# Patient Record
Sex: Female | Born: 1990 | Race: White | Hispanic: Yes | Marital: Married | Smoking: Never smoker
Health system: Southern US, Community
[De-identification: ages and names within clinical notes are randomized; demographics above are authoritative.]

## PROBLEM LIST (undated history)

## (undated) DIAGNOSIS — Z789 Other specified health status: Secondary | ICD-10-CM

## (undated) HISTORY — DX: Other specified health status: Z78.9

---

## 2020-08-22 HISTORY — PX: FINGER SURGERY: SHX640

## 2020-10-30 ENCOUNTER — Ambulatory Visit: Payer: Self-pay

## 2020-11-06 ENCOUNTER — Ambulatory Visit (LOCAL_COMMUNITY_HEALTH_CENTER): Payer: Self-pay | Admitting: Physician Assistant

## 2020-11-06 ENCOUNTER — Other Ambulatory Visit: Payer: Self-pay

## 2020-11-06 VITALS — BP 110/67 | Ht 66.0 in | Wt 160.0 lb

## 2020-11-06 DIAGNOSIS — Z3009 Encounter for other general counseling and advice on contraception: Secondary | ICD-10-CM

## 2020-11-06 DIAGNOSIS — Z30011 Encounter for initial prescription of contraceptive pills: Secondary | ICD-10-CM

## 2020-11-06 DIAGNOSIS — Z0000 Encounter for general adult medical examination without abnormal findings: Secondary | ICD-10-CM

## 2020-11-06 MED ORDER — NORGESTIMATE-ETH ESTRADIOL 0.25-35 MG-MCG PO TABS: 1.0000 | ORAL_TABLET | Freq: Every day | ORAL | 0 refills | Status: AC

## 2020-11-06 NOTE — Progress Notes (Signed)
#  1 pack of Sprintec given and Nexplanon insertion appt given.  Consult completed Richmond Campbell, RN

## 2020-11-06 NOTE — Progress Notes (Signed)
[facility] DEPARTMENT [facility] [address] Main Number: [phone]    Family Planning Visit- Initial Visit  Subjective:  Jennifer Rice is a 30 y.o.  G1P1001   being seen today for an initial annual visit and to discuss contraceptive options.  The patient is currently using None for pregnancy prevention. Patient reports she does not want a pregnancy in the next year.  Patient has the following medical conditions does not have a problem list on file.  Chief Complaint  Patient presents with  . Contraception    Initial annual exam and discuss BCM.    Patient reports that she would like to start birth control.  Reports that she has previously used an IUD and OCs and thinks that she would like to try the Nexplanon.  Reports a history of irregular periods and LMP 11/02/2020 and normal.  Last type of OCs was monophasic, with 2 colors of OCs.  Patient reports last pap was 2 years ago in [place]" and was normal.  Patient declines pelvic today for pap and declines all STD screening including blood work.  Reports that she has had headaches off and on always and denies any increase or change to them.  States headaches are usually related to stressors and relieved with OTC analgesics. Patient counseled that pap would be due next year.  Patient denies any other concerns today.    Body mass index is 25.82 kg/m. - Patient is eligible for diabetes screening based on BMI and age >30?  not applicable HA1C ordered? not applicable  Patient reports 1  partner/s in last year. Desires STI screening?  No - patient declines.  Has patient been screened once for HCV in the past?  No  No results found for: HCVAB  Does the patient have current drug use (including MJ), have a partner with drug use, and/or has been incarcerated since last result? No  If yes-- Screen for HCV through [facility]   Does the patient meet criteria for HBV testing? No  Criteria:   -Household, sexual or needle sharing contact with HBV -History of drug use -HIV positive -Those with known Hep C   Health Maintenance Due  Topic Date Due  . Hepatitis C Screening  Never done  . HIV Screening  Never done  . TETANUS/TDAP  Never done  . PAP-Cervical Cytology Screening  Never done  . PAP SMEAR-Modifier  Never done  . INFLUENZA VACCINE  Never done    Review of Systems  All other systems reviewed and are negative.   The following portions of the patient's history were reviewed and updated as appropriate: allergies, current medications, past family history, past medical history, past social history, past surgical history and problem list. Problem list updated.   See flowsheet for other program required questions.  Objective:   Vitals:   11/06/20 0941  BP: 110/67  Weight: 160 lb (72.6 kg)  Height: 5\' 6"  (1.676 m)    Physical Exam Vitals and nursing note reviewed.  Constitutional:      General: She is not in acute distress.    Appearance: Normal appearance.  HENT:     Head: Normocephalic and atraumatic.     Mouth/Throat:     Mouth: Mucous membranes are moist.     Pharynx: Oropharynx is clear. No oropharyngeal exudate or posterior oropharyngeal erythema.  Eyes:     Conjunctiva/sclera: Conjunctivae normal.  Neck:     Thyroid: No thyroid mass, thyromegaly or thyroid  tenderness.  Cardiovascular:     Rate and Rhythm: Normal rate and regular rhythm.  Pulmonary:     Effort: Pulmonary effort is normal.     Breath sounds: Normal breath sounds.  Chest:  Breasts:     Right: Normal. No mass, nipple discharge, skin change, tenderness, axillary adenopathy or supraclavicular adenopathy.     Left: Normal. No mass, nipple discharge, skin change, tenderness, axillary adenopathy or supraclavicular adenopathy.    Abdominal:     Palpations: Abdomen is soft. There is no mass.     Tenderness: There is no abdominal tenderness. There is no guarding or rebound.   Musculoskeletal:     Cervical back: Neck supple. No tenderness.  Lymphadenopathy:     Cervical: No cervical adenopathy.     Upper Body:     Right upper body: No supraclavicular, axillary or pectoral adenopathy.     Left upper body: No supraclavicular, axillary or pectoral adenopathy.  Skin:    General: Skin is warm and dry.     Findings: No bruising, erythema, lesion or rash.  Neurological:     Mental Status: She is alert and oriented to person, place, and time.  Psychiatric:        Mood and Affect: Mood normal.        Behavior: Behavior normal.        Thought Content: Thought content normal.        Judgment: Judgment normal.       Assessment and Plan:  Jennifer Rice is a 30 y.o. female presenting to the [facility] Department for an initial annual wellness/contraceptive visit  Contraception counseling: Reviewed all forms of birth control options in the tiered based approach. available including abstinence; over the counter/barrier methods; hormonal contraceptive medication including pill, patch, ring, injection,contraceptive implant, ECP; hormonal and nonhormonal IUDs; permanent sterilization options including vasectomy and the various tubal sterilization modalities. Risks, benefits, and typical effectiveness rates were reviewed.  Questions were answered.  Written information was also given to the patient to review.  Patient desires to have a Nexplanon inserted but will start OCs in the interim, this was prescribed for patient. She will follow up in  2-3 weeks for Nexplanon insertion, 1 year for RP and prn for surveillance.  She was told to call with any further questions, or with any concerns about this method of contraception.  Emphasized use of condoms 100% of the time for STI prevention.  Patient was not a candidate for ECP today.   1. Encounter for counseling regarding contraception Reviewed with patient as above for all BCMs. Reviewed in depth information for  Nexplanon and OCs. Enc condoms with all sex as back up until 10 days after insertion of Nexplanon and always for STD protection.   2. Well woman exam (no gynecological exam) Reviewed with patient healthy habits to maintain general health. Enc MVI 1 po daily. Counseled that patient will need a pap at RP next year per her history of normal pap 2 years ago. Enc to establish with/ follow up with PCP for primary care concerns, age appropriate screenings and illness.  3. OCP (oral contraceptive pills) initiation OK to start Sprintec 28 d #1 1 po daily at the same time each day today. RTC for Nexplanon insertion per appointment. - norgestimate-ethinyl estradiol (SPRINTEC 28) 0.25-35 MG-MCG tablet; Take 1 tablet by mouth daily.  Dispense: 28 tablet; Refill: 0     Return in about 1 year (around 11/06/2021) for RP and prn.  Future  Appointments  Date Time Provider Department Center  11/12/2020 10:40 AM [facility] PROVIDER [facility] None    Matt Holmes, PA

## 2020-11-06 NOTE — Progress Notes (Signed)
Here for PE and BCM. Would like Nexplanon but is ok starting pills until her insertion appt. Declines HIV/RPR testing Richmond Campbell, RN

## 2020-11-12 ENCOUNTER — Ambulatory Visit (LOCAL_COMMUNITY_HEALTH_CENTER): Payer: Self-pay | Admitting: Physician Assistant

## 2020-11-12 ENCOUNTER — Other Ambulatory Visit: Payer: Self-pay

## 2020-11-12 DIAGNOSIS — Z30017 Encounter for initial prescription of implantable subdermal contraceptive: Secondary | ICD-10-CM

## 2020-11-12 DIAGNOSIS — Z3009 Encounter for other general counseling and advice on contraception: Secondary | ICD-10-CM

## 2020-11-12 MED ORDER — ETONOGESTREL 68 MG ~~LOC~~ IMPL
68.0000 mg | DRUG_IMPLANT | Freq: Once | SUBCUTANEOUS | Status: AC
  Administered 2020-11-12: 68 mg via SUBCUTANEOUS

## 2020-11-13 NOTE — Progress Notes (Signed)
[facility]  [facility]  Subjective:  Jennifer Rice is a 30 y.o. being seen today for Nexplanon insertion.  Chief Complaint  Patient presents with  . Contraception    Nexplanon insertion    HPI  Patient states that she has been taking the OCs she was given last week at the same time every day and has not had sex since before her last visit.  States that she still wants to proceed with getting a Nexplanon today.    Does the patient have a current or past history of drug use? No   No components found for: HCV]   Health Maintenance Due  Topic Date Due  . Hepatitis C Screening  Never done  . HIV Screening  Never done  . TETANUS/TDAP  Never done  . PAP-Cervical Cytology Screening  Never done  . PAP SMEAR-Modifier  Never done  . INFLUENZA VACCINE  Never done    Review of Systems  All other systems reviewed and are negative.   The following portions of the patient's history were reviewed and updated as appropriate: allergies, current medications, past family history, past medical history, past social history, past surgical history and problem list. Problem list updated.   See flowsheet for other program required questions.  Objective:  There were no vitals filed for this visit.  Physical Exam Vitals reviewed.  Constitutional:      General: She is not in acute distress.    Appearance: Normal appearance.  HENT:     Head: Normocephalic and atraumatic.  Eyes:     Conjunctiva/sclera: Conjunctivae normal.  Pulmonary:     Effort: Pulmonary effort is normal.  Skin:    General: Skin is warm and dry.  Neurological:     Mental Status: She is alert and oriented to person, place, and time.  Psychiatric:        Mood and Affect: Mood normal.        Behavior: Behavior normal.        Thought Content: Thought content normal.        Judgment: Judgment normal.       Assessment and Plan:  [name] is a 30  y.o. female presenting to the [facility] Department for a Women's Health problem visit  1. Encounter for counseling regarding contraception Reviewed with patient risks, benefits, and SE of Nexplanon and OCs. Counseled patient to complete the current pack of OCs as her back up after insertion. Enc condoms with all sex for STD protection.   2. Nexplanon insertion Nexplanon Insertion Procedure Patient identified, informed consent performed, consent signed.   Patient does understand that irregular bleeding is a very common side effect of this medication. She was advised to have backup contraception after placement. Patient was determined to meet WHO criteria for not being pregnant. Appropriate time out taken.  The insertion site was identified 8-10 cm (3-4 inches) from the medial epicondyle of the humerus and 3-5 cm (1.25-2 inches) posterior to (below) the sulcus (groove) between the biceps and triceps muscles of the patient's left arm and marked.  Patient was prepped with alcohol swab and then injected with 3 ml of 1% lidocaine.  Arm was prepped with chlorhexidene, Nexplanon removed from packaging,  Device confirmed in needle, then inserted full length of needle and withdrawn per handbook instructions. Nexplanon was able to palpated in the patient's arm; patient palpated the insert herself. There was minimal blood loss.  Patient insertion  site covered with guaze and a pressure bandage to reduce any bruising.  The patient tolerated the procedure well and was given post procedure instructions.  Nexplanon:   Counseled patient to take OTC analgesic starting as soon as lidocaine starts to wear off and take regularly for at least 48 hr to decrease discomfort.  Specifically to take with food or milk to decrease stomach upset and for IB 600 mg (3 tablets) every 6 hrs; IB 800 mg (4 tablets) every 8 hrs; or Aleve 2 tablets every 12 hrs.   - etonogestrel (NEXPLANON) implant 68 mg     Return in  about 1 year (around 11/12/2021) for RP and prn.  No future appointments.  Matt Holmes, PA

## 2021-07-01 ENCOUNTER — Emergency Department: Payer: Worker's Compensation

## 2021-07-01 ENCOUNTER — Other Ambulatory Visit: Payer: Self-pay

## 2021-07-01 ENCOUNTER — Emergency Department
Admission: EM | Admit: 2021-07-01 | Discharge: 2021-07-01 | Disposition: A | Discharge status: Home / Self Care | Payer: Worker's Compensation | Attending: Emergency Medicine | Admitting: Emergency Medicine

## 2021-07-01 DIAGNOSIS — W311XXA Contact with metalworking machines, initial encounter: Secondary | ICD-10-CM | POA: Insufficient documentation

## 2021-07-01 DIAGNOSIS — S6992XA Unspecified injury of left wrist, hand and finger(s), initial encounter: Secondary | ICD-10-CM | POA: Diagnosis present

## 2021-07-01 DIAGNOSIS — S62633B Displaced fracture of distal phalanx of left middle finger, initial encounter for open fracture: Secondary | ICD-10-CM | POA: Insufficient documentation

## 2021-07-01 DIAGNOSIS — S61412A Laceration without foreign body of left hand, initial encounter: Secondary | ICD-10-CM | POA: Diagnosis not present

## 2021-07-01 DIAGNOSIS — R000 Tachycardia, unspecified: Secondary | ICD-10-CM | POA: Insufficient documentation

## 2021-07-01 DIAGNOSIS — S62639B Displaced fracture of distal phalanx of unspecified finger, initial encounter for open fracture: Secondary | ICD-10-CM

## 2021-07-01 MED ORDER — CEPHALEXIN 500 MG PO CAPS: 500.0000 mg | ORAL_CAPSULE | Freq: Four times a day (QID) | ORAL | 0 refills | Status: AC

## 2021-07-01 MED ORDER — LIDOCAINE HCL 1 % IJ SOLN
10.0000 mL | Freq: Once | INTRAMUSCULAR | Status: AC
  Administered 2021-07-01: 10 mL
  Filled 2021-07-01: qty 10

## 2021-07-01 MED ORDER — HYDROCODONE-ACETAMINOPHEN 5-325 MG PO TABS: 1.0000 | ORAL_TABLET | Freq: Four times a day (QID) | ORAL | 0 refills | Status: AC | PRN

## 2021-07-01 MED ORDER — FENTANYL CITRATE (PF) 250 MCG/5ML IJ SOLN
75.0000 ug | Freq: Once | INTRAMUSCULAR | Status: AC
  Administered 2021-07-01: 75 ug via INTRAMUSCULAR
  Filled 2021-07-01: qty 5

## 2021-07-01 MED ORDER — ONDANSETRON 4 MG PO TBDP: 4.0000 mg | ORAL_TABLET | Freq: Three times a day (TID) | ORAL | 0 refills | Status: AC | PRN

## 2021-07-01 NOTE — ED Notes (Signed)
See triage note  presents with laceration  states she cut her hand on machine at work  laceration noted to left middle finger

## 2021-07-01 NOTE — Discharge Instructions (Signed)
Have external sutures removed in ten days.  Take Keflex 4 times daily for the next seven days. Please follow up with Orthopedics.

## 2021-07-01 NOTE — ED Triage Notes (Signed)
Pt states she cut her middle finger on the left hand with a machine used to cut cloth

## 2021-07-01 NOTE — ED Provider Notes (Signed)
[facility]-EMERGENCY DEPARTMENT  ____________________________________________  Time seen: Approximately 3:55 PM  I have reviewed the triage vital signs and the nursing notes.   HISTORY  Chief Complaint Laceration   Historian Patient     HPI Jennifer Rice Alfredo Bach is a 30 y.o. female presents to the emergency department with the left middle finger laceration sustained accidentally with the machine used to cut cloth.  No numbness or tingling in the left hand.   History reviewed. No pertinent past medical history.   Immunizations up to date:  Yes.     History reviewed. No pertinent past medical history.  There are no problems to display for this patient.   History reviewed. No pertinent surgical history.  Prior to Admission medications   Medication Sig Start Date End Date Taking? Authorizing Provider  cephALEXin (KEFLEX) 500 MG capsule Take 1 capsule (500 mg total) by mouth 4 (four) times daily for 7 days. 07/01/21 07/08/21 Yes Pia Mau M, PA-C  HYDROcodone-acetaminophen (NORCO) 5-325 MG tablet Take 1 tablet by mouth every 6 (six) hours as needed for up to 3 days. 07/01/21 07/04/21 Yes Pia Mau M, PA-C  ondansetron (ZOFRAN ODT) 4 MG disintegrating tablet Take 1 tablet (4 mg total) by mouth every 8 (eight) hours as needed for up to 5 days. 07/01/21 07/06/21 Yes Pia Mau M, PA-C  norgestimate-ethinyl estradiol (SPRINTEC 28) 0.25-35 MG-MCG tablet Take 1 tablet by mouth daily. 11/06/20   Matt Holmes, PA    Allergies Patient has no known allergies.  Family History  Problem Relation Age of Onset   Diabetes Maternal Grandmother     Social History Social History   Tobacco Use   Smoking status: Never   Smokeless tobacco: Never  Vaping Use   Vaping Use: Never used  Substance Use Topics   Alcohol use: Not Currently   Drug use: Never     Review of Systems  Constitutional: No fever/chills Eyes:  No discharge ENT: No upper respiratory  complaints. Respiratory: no cough. No SOB/ use of accessory muscles to breath Gastrointestinal:   No nausea, no vomiting.  No diarrhea.  No constipation. Musculoskeletal: Negative for musculoskeletal pain. Skin: Patient has hand laceration.     ____________________________________________   PHYSICAL EXAM:  VITAL SIGNS: ED Triage Vitals  Enc Vitals Group     BP 07/01/21 1538 (!) 137/94     Pulse Rate 07/01/21 1538 (!) 123     Resp 07/01/21 1538 20     Temp 07/01/21 1538 98.1 F (36.7 C)     Temp Source 07/01/21 1538 Oral     SpO2 07/01/21 1538 98 %     Weight 07/01/21 1538 140 lb (63.5 kg)     Height 07/01/21 1551 5\' 6"  (1.676 m)     Head Circumference --      Peak Flow --      Pain Score 07/01/21 1537 10     Pain Loc --      Pain Edu? --      Excl. in GC? --      Constitutional: Alert and oriented. Well appearing and in no acute distress. Eyes: Conjunctivae are normal. PERRL. EOMI. Head: Atraumatic. ENT: Cardiovascular: Normal rate, regular rhythm. Normal S1 and S2.  Good peripheral circulation. Respiratory: Normal respiratory effort without tachypnea or retractions. Lungs CTAB. Good air entry to the bases with no decreased or absent breath sounds Gastrointestinal: Bowel sounds x 4 quadrants. Soft and nontender to palpation. No guarding or rigidity. No distention. Musculoskeletal: Full range  of motion to all extremities. No obvious deformities noted Neurologic:  Normal for age. No gross focal neurologic deficits are appreciated.  Skin: Patient has left third digit laceration.  Laceration is distal and circumferential and deep to underlying adipose tissue with no tendon exposure. Capillary refill less than 2 seconds on the left.  Psychiatric: Mood and affect are normal for age. Speech and behavior are normal.   ____________________________________________   LABS (all labs ordered are listed, but only abnormal results are displayed)  Labs Reviewed - No data to  display ____________________________________________  EKG   ____________________________________________  RADIOLOGY Geraldo Pitter, personally viewed and evaluated these images (plain radiographs) as part of my medical decision making, as well as reviewing the written report by the radiologist.   DG Hand Complete Left  Result Date: 07/01/2021 CLINICAL DATA:  Laceration to the long finger of the left hand. EXAM: LEFT HAND - COMPLETE 3+ VIEW COMPARISON:  None. FINDINGS: There is an oblique, mildly displaced fracture of the tuft of the distal phalanx of the long finger. There is evidence of associated soft tissue injury with swelling and multiple punctate and curvilinear foreign bodies about the fracture. There is no dislocation. Lunotriquetral joint space narrowing and subchondral cysts are noted. IMPRESSION: Fracture of the distal tuft of the long finger with soft tissue injury and foreign bodies. Electronically Signed   By: Sebastian Ache M.D.   On: 07/01/2021 16:26    ____________________________________________    PROCEDURES  Procedure(s) performed:     Marland KitchenMarland KitchenLaceration Repair  Date/Time: 07/01/2021 3:57 PM Performed by: Orvil Feil, PA-C Authorized by: Orvil Feil, PA-C   Consent:    Consent obtained:  Verbal   Risks discussed:  Infection and pain Universal protocol:    Procedure explained and questions answered to patient or proxy's satisfaction: yes     Patient identity confirmed:  Verbally with patient Anesthesia:    Anesthesia method:  Nerve block   Block anesthetic:  Lidocaine 1% w/o epi Laceration details:    Location:  Finger   Finger location:  L long finger   Length (cm):  3   Depth (mm):  5 Pre-procedure details:    Preparation:  Patient was prepped and draped in usual sterile fashion Exploration:    Limited defect created (wound extended): no     Contaminated: no   Treatment:    Area cleansed with:  Povidone-iodine   Amount of cleaning:   Standard   Irrigation volume:  500   Visualized foreign bodies/material removed: no     Debridement:  None Skin repair:    Repair method:  Sutures   Suture size:  4-0   Suture technique:  Simple interrupted   Number of sutures:  10 Approximation:    Approximation:  Close     Medications  fentaNYL citrate (PF) (SUBLIMAZE) injection 75 mcg (75 mcg Intramuscular Given 07/01/21 1608)  lidocaine (XYLOCAINE) 1 % (with pres) injection 10 mL (10 mLs Infiltration Given 07/01/21 1608)     ____________________________________________   INITIAL IMPRESSION / ASSESSMENT AND PLAN / ED COURSE  Pertinent labs & imaging results that were available during my care of the patient were reviewed by me and considered in my medical decision making (see chart for details).      Assessment and plan: Finger pain: Finger laceration:  Presents to the emergency department with a left third digit laceration sustained with a grinder.  Patient was tachycardic at triage but vital signs were otherwise reassuring.  Use of a Spanish translator was used during this emergency department encounter.  Patient did have comminuted distal tuft fracture on x-ray.  Laceration was repaired in the emergency department without complication and she was advised to have external sutures removed in 10 days.  She was discharged with Keflex.  She reports her tetanus status is up-to-date.  Patient denies possibility of pregnancy.  She was discharged with a short course of Norco for pain.  Her left third digit was splinted into extension and she was advised to follow-up with orthopedics.    ____________________________________________  FINAL CLINICAL IMPRESSION(S) / ED DIAGNOSES  Final diagnoses:  Laceration of left hand without foreign body, initial encounter  Open fracture of tuft of distal phalanx of finger      NEW MEDICATIONS STARTED DURING THIS VISIT:  ED Discharge Orders          Ordered    cephALEXin (KEFLEX)  500 MG capsule  4 times daily        07/01/21 1737    HYDROcodone-acetaminophen (NORCO) 5-325 MG tablet  Every 6 hours PRN        07/01/21 1737    ondansetron (ZOFRAN ODT) 4 MG disintegrating tablet  Every 8 hours PRN        07/01/21 1737                This chart was dictated using voice recognition software/Dragon. Despite best efforts to proofread, errors can occur which can change the meaning. Any change was purely unintentional.     Orvil Feil, PA-C 07/01/21 1748    Jene Every, MD 07/01/21 1756

## 2021-07-11 ENCOUNTER — Encounter: Payer: Self-pay | Admitting: Emergency Medicine

## 2021-07-11 ENCOUNTER — Emergency Department
Admission: EM | Admit: 2021-07-11 | Discharge: 2021-07-11 | Disposition: A | Discharge status: Home / Self Care | Payer: Worker's Compensation | Attending: Emergency Medicine | Admitting: Emergency Medicine

## 2021-07-11 ENCOUNTER — Other Ambulatory Visit: Payer: Self-pay

## 2021-07-11 DIAGNOSIS — W268XXD Contact with other sharp object(s), not elsewhere classified, subsequent encounter: Secondary | ICD-10-CM | POA: Diagnosis not present

## 2021-07-11 DIAGNOSIS — S61213D Laceration without foreign body of left middle finger without damage to nail, subsequent encounter: Secondary | ICD-10-CM | POA: Diagnosis not present

## 2021-07-11 DIAGNOSIS — L089 Local infection of the skin and subcutaneous tissue, unspecified: Secondary | ICD-10-CM

## 2021-07-11 DIAGNOSIS — Y990 Civilian activity done for income or pay: Secondary | ICD-10-CM | POA: Diagnosis not present

## 2021-07-11 DIAGNOSIS — S61209A Unspecified open wound of unspecified finger without damage to nail, initial encounter: Secondary | ICD-10-CM

## 2021-07-11 DIAGNOSIS — Z4802 Encounter for removal of sutures: Secondary | ICD-10-CM | POA: Insufficient documentation

## 2021-07-11 MED ORDER — SULFAMETHOXAZOLE-TRIMETHOPRIM 800-160 MG PO TABS: 2.0000 | ORAL_TABLET | Freq: Two times a day (BID) | ORAL | 0 refills | Status: AC

## 2021-07-11 MED ORDER — ONDANSETRON 4 MG PO TBDP: 4.0000 mg | ORAL_TABLET | Freq: Four times a day (QID) | ORAL | 0 refills | Status: AC | PRN

## 2021-07-11 MED ORDER — CEPHALEXIN 500 MG PO CAPS: 500.0000 mg | ORAL_CAPSULE | Freq: Four times a day (QID) | ORAL | 0 refills | Status: AC

## 2021-07-11 MED ORDER — HYDROCODONE-ACETAMINOPHEN 5-325 MG PO TABS
2.0000 | ORAL_TABLET | Freq: Once | ORAL | Status: AC
  Administered 2021-07-11: 2 via ORAL
  Filled 2021-07-11: qty 2

## 2021-07-11 MED ORDER — HYDROCODONE-ACETAMINOPHEN 5-325 MG PO TABS: 1.0000 | ORAL_TABLET | Freq: Four times a day (QID) | ORAL | 0 refills | Status: AC | PRN

## 2021-07-11 NOTE — ED Provider Notes (Signed)
[facility] Emergency Department Provider Note   ____________________________________________   Event Date/Time   First MD Initiated Contact with Patient 07/11/21 1417     (approximate)  I have reviewed the triage vital signs and the nursing notes.   HISTORY  Chief Complaint Suture / Staple Removal    HPI Jennifer Rice is a 30 y.o. female who injured her left finger with a cutting device at work about 10 days ago.  She returns to have her sutures removed.  The injury was to the left middle fingertip only.  She had noticed however that she has been having numbness over the inner portion of the left middle finger and over the last several days she has noticed the area seems to be slightly swollen and red at that end of the finger, also having some color changes with a slight darkening of the skin from about the far knuckle to the fingertip for the last couple of days.  The area is painful.  No fevers or chills.  No recent illness.  All sutures have been removed.  She was taking hydrocodone for pain but has since used that, and also completed 7 days of antibiotic.  Denies pregnancy.  Currently on birth control   History reviewed. No pertinent past medical history.  There are no problems to display for this patient.   History reviewed. No pertinent surgical history.  Prior to Admission medications   Medication Sig Start Date End Date Taking? Authorizing Provider  cephALEXin (KEFLEX) 500 MG capsule Take 1 capsule (500 mg total) by mouth 4 (four) times daily. 07/11/21  Yes Sharyn Creamer, MD  HYDROcodone-acetaminophen (NORCO/VICODIN) 5-325 MG tablet Take 1 tablet by mouth every 6 (six) hours as needed for moderate pain. 07/11/21  Yes Sharyn Creamer, MD  ondansetron (ZOFRAN ODT) 4 MG disintegrating tablet Take 1 tablet (4 mg total) by mouth every 6 (six) hours as needed for nausea or vomiting. 07/11/21  Yes Sharyn Creamer, MD  sulfamethoxazole-trimethoprim  (BACTRIM DS) 800-160 MG tablet Take 2 tablets by mouth 2 (two) times daily. 07/11/21  Yes Sharyn Creamer, MD  norgestimate-ethinyl estradiol (SPRINTEC 28) 0.25-35 MG-MCG tablet Take 1 tablet by mouth daily. 11/06/20   Matt Holmes, PA    Allergies Patient has no known allergies.  Family History  Problem Relation Age of Onset   Diabetes Maternal Grandmother     Social History Social History   Tobacco Use   Smoking status: Never   Smokeless tobacco: Never  Vaping Use   Vaping Use: Never used  Substance Use Topics   Alcohol use: Not Currently   Drug use: Never    Review of Systems Constitutional: No fever/chills Gastrointestinal: No nausea Genitourinary: Denies pregnancy Musculoskeletal: The HPI.  Ports the pain in her distal fingertip does shoot up towards her left forearm off-and-on at times Skin: Negative for rash except regarding tip of the left finger.  Has not seen any drainage or breakage of skin Neurological: Negative for focal weakness or numbness but for some numbing sensation in over the tip of the left finger is been present for several days.  Will Spanish interpreter utilized throughout to collect history during examination and during suture removal.  Video interpreter services  ____________________________________________   PHYSICAL EXAM:  VITAL SIGNS: ED Triage Vitals  Enc Vitals Group     BP --      Pulse --      Resp --      Temp --  Temp src --      SpO2 --      Weight 07/11/21 1127 141 lb 1.5 oz (64 kg)     Height 07/11/21 1127 5\' 6"  (1.676 m)     Head Circumference --      Peak Flow --      Pain Score 07/11/21 1126 8     Pain Loc --      Pain Edu? --      Excl. in GC? --     Constitutional: Alert and oriented. Well appearing and in no acute distress.  Pleasant, seated in chair Eyes: Conjunctivae are normal. Head: Atraumatic. Nose: No congestion/rhinnorhea. Neck: No stridor.  Cardiovascular: Normal rate, regular rhythm. Grossly  normal heart sounds.  Good peripheral circulation. Respiratory: Normal respiratory effort.  No retractions. Lungs CTAB. Musculoskeletal:   LEFT Left upper extremity demonstrates normal strength, good use of all muscles. No edema bruising or contusions of the left shoulder/upper arm, left elbow, left forearm / hand with exception to the left middle phalanx the distal tuft appears slightly dark perhaps with very mild ischemia or slowness of venous return as well as slight swelling and mild erythema with a couple areas of granulation tissue following along with patient her suture lines for which there are 10 sutures present all of which I removed successfully and completely without evidence to suggest retaining. Full range of motion of the left  upper extremity without pain except at the tip of the left middle finger. Strong radial pulse. Intact median/ulnar/radial neuro-muscular exam although she does report diminishment of sensation primarily along the median digital nerve at the distal tuft of the left finger middle finger.  Overall there is no evidence of drainage obvious fluid collection, or odor or purulence.  However there is some inclination of slight potential for diminished circulation involving the distal tuft of the left index finger though does demonstrate present but slow capillary refill.  There is also small area of erythema at the distal tuft   Neurologic:  Normal speech and language. No gross focal neurologic deficits are appreciated.  Skin:  Skin is warm, dry and intact. No rash noted. Psychiatric: Mood and affect are normal. Speech and behavior are normal.  ____________________________________________   LABS (all labs ordered are listed, but only abnormal results are displayed)  Labs Reviewed - No data to  display ____________________________________________  EKG   ____________________________________________  RADIOLOGY   ____________________________________________   PROCEDURES  Procedure(s) performed:  suture removal  .Suture Removal  Date/Time: 07/11/2021 3:43 PM Performed by: 07/13/2021, MD Authorized by: Sharyn Creamer, MD   Consent:    Consent obtained:  Verbal   Consent given by:  Patient   Risks discussed:  Pain Universal protocol:    Procedure explained and questions answered to patient or proxy's satisfaction: yes     Relevant documents present and verified: yes     Patient identity confirmed:  Verbally with patient Location:    Location:  Upper extremity   Upper extremity location:  Hand   Hand location:  L long finger Procedure details:    Wound appearance:  Tender and indurated   Number of sutures removed:  10 Post-procedure details:    Post-removal:  No dressing applied   Procedure completion:  Tolerated well, no immediate complications Comments:     Pain with suture removal, but tolerated well. No deeply imbedded sutures.  Critical Care performed: No  ____________________________________________   INITIAL IMPRESSION / ASSESSMENT AND PLAN / ED COURSE  Pertinent labs & imaging results that were available during my care of the patient were reviewed by me and considered in my medical decision making (see chart for details).   Presents for suture removal but also having some swelling slight erythema and slow capillary refill to the distal tuft left index finger now for several days.  Does appear there may be some element of superinfection, and she completed course of cephalexin.  Discussed with orthopedics and they recommend follow-up tomorrow the urgent care for reassessment with their team, and also placed on Keflex and Bactrim once again.  I have initiated these discussed with the patient and also with her sister.  Patient also amenable to receiving  additional prescription for hydrocodone which she has previously completed.  I am concerned about superinfection and slow poor wound healing involving the left distal finger.  There is no obvious gangrene or tenosynovitis or extensive cellulitic change, but I am concerned for possible pulp space or periincisional infection.  Clinical Course as of 07/11/21 1545  Sun Jul 11, 2021  1528 Dr. Hyacinth Meeker, orthopedics advises to have patient follow-up tomorrow at noon via the [facility] urgent care for reassessment and evaluation of the left fingertip.  I think this is agreeable.  Patient and her sister also agreeable with this plan.  Reviewed clinical history perform examination and did suture removal with telemetry interpreter services.  Sister will be driving her home.  Discussed antibiotics, pain control, and follow-up with specific directions including need for follow-up tomorrow at noon via [facility].  Patient agreeable [MQ]    Clinical Course User Index [MQ] Sharyn Creamer, MD    I will prescribe the patient a narcotic pain medicine due to their condition which I anticipate will cause at least moderate pain short term. I discussed with the patient safe use of narcotic pain medicines, and that they are not to drive, work in dangerous areas, or ever take more than prescribed (no more than 1 pill every 6 hours). We discussed that this is the type of medication that can be  overdosed on and the risks of this type of medicine. Patient is very agreeable to only use as prescribed and to never use more than prescribed. Sister driving her home.  ----------------------------------------- 3:47 PM on 07/11/2021 -----------------------------------------  Return precautions and treatment recommendations and follow-up discussed with the patient who is agreeable with the plan.  ____________________________________________   FINAL CLINICAL IMPRESSION(S) / ED DIAGNOSES  Final diagnoses:  Open wound of finger,  infected, initial encounter        Note:  This document was prepared using Dragon voice recognition software and may include unintentional dictation errors       Sharyn Creamer, MD 07/11/21 1547

## 2021-07-11 NOTE — ED Notes (Signed)
Dc ppw provided. Pt denies any questions. Pt followup and rx info given. Pt provides verbal consent for dc. Pt assisted off unit,

## 2021-07-11 NOTE — ED Triage Notes (Signed)
Pt reports here for stitch removal. Pt reports her finger will hurt at night and can't move it like she used to and it itches.

## 2021-12-16 IMAGING — DX DG HAND COMPLETE 3+V*L*
3 series · 3 of 3 positions shown · non-contrast
Comparison: None.

CLINICAL DATA: Laceration to the long finger of the left hand.

EXAM:
LEFT HAND - COMPLETE 3+ VIEW

[hand ap]
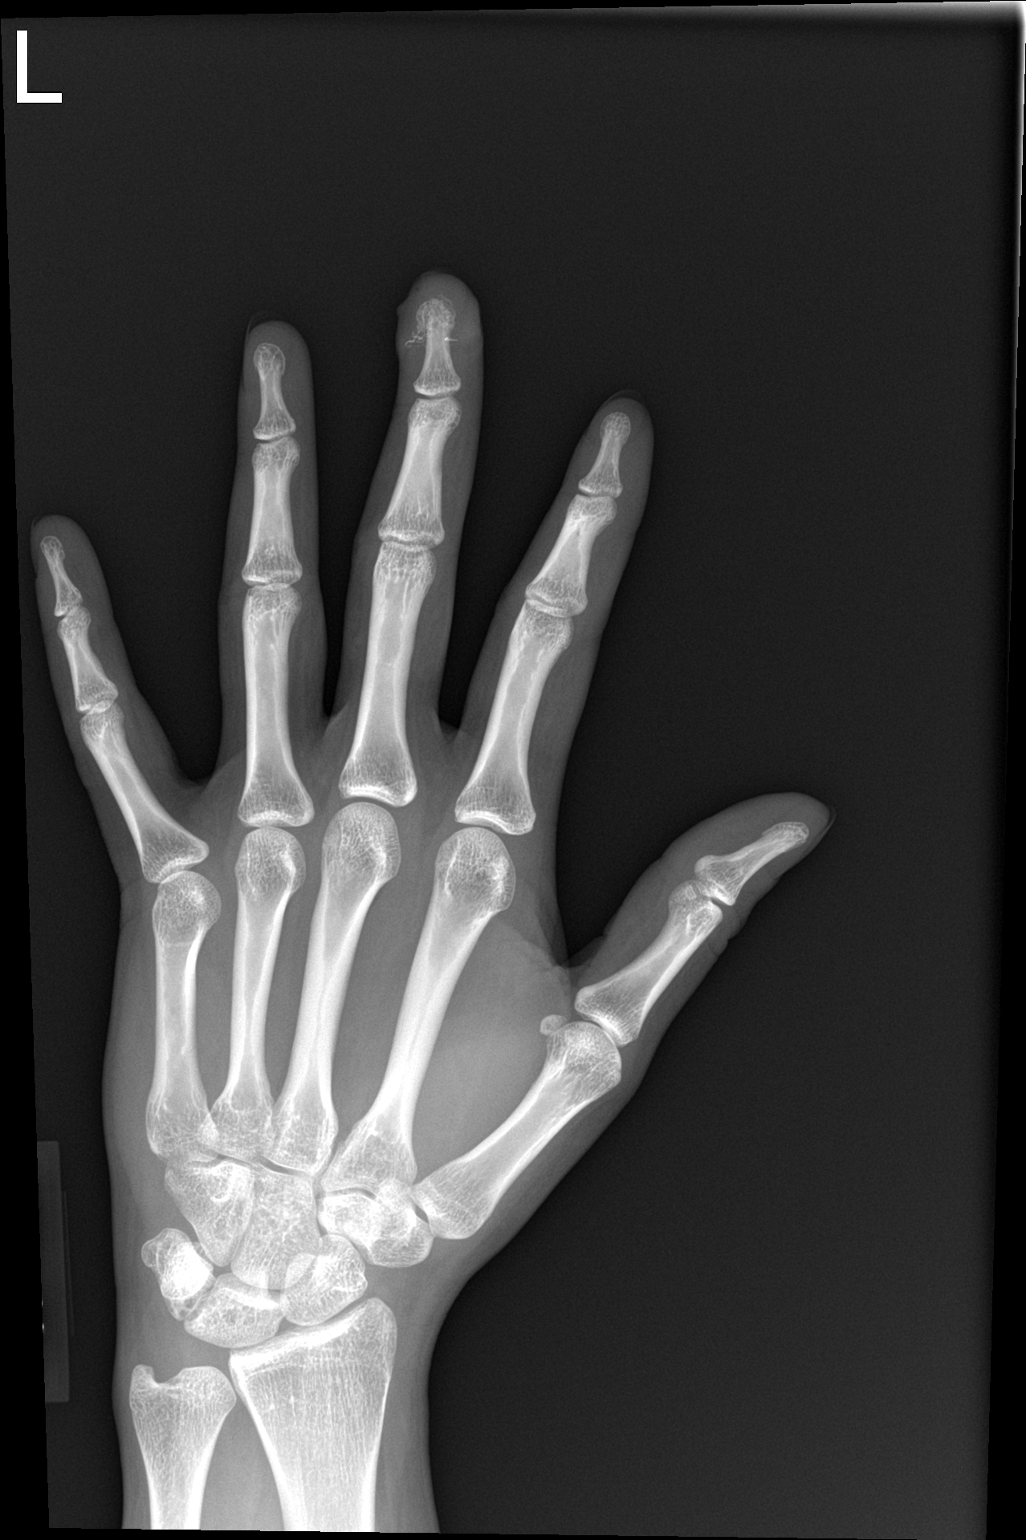

[hand obl]
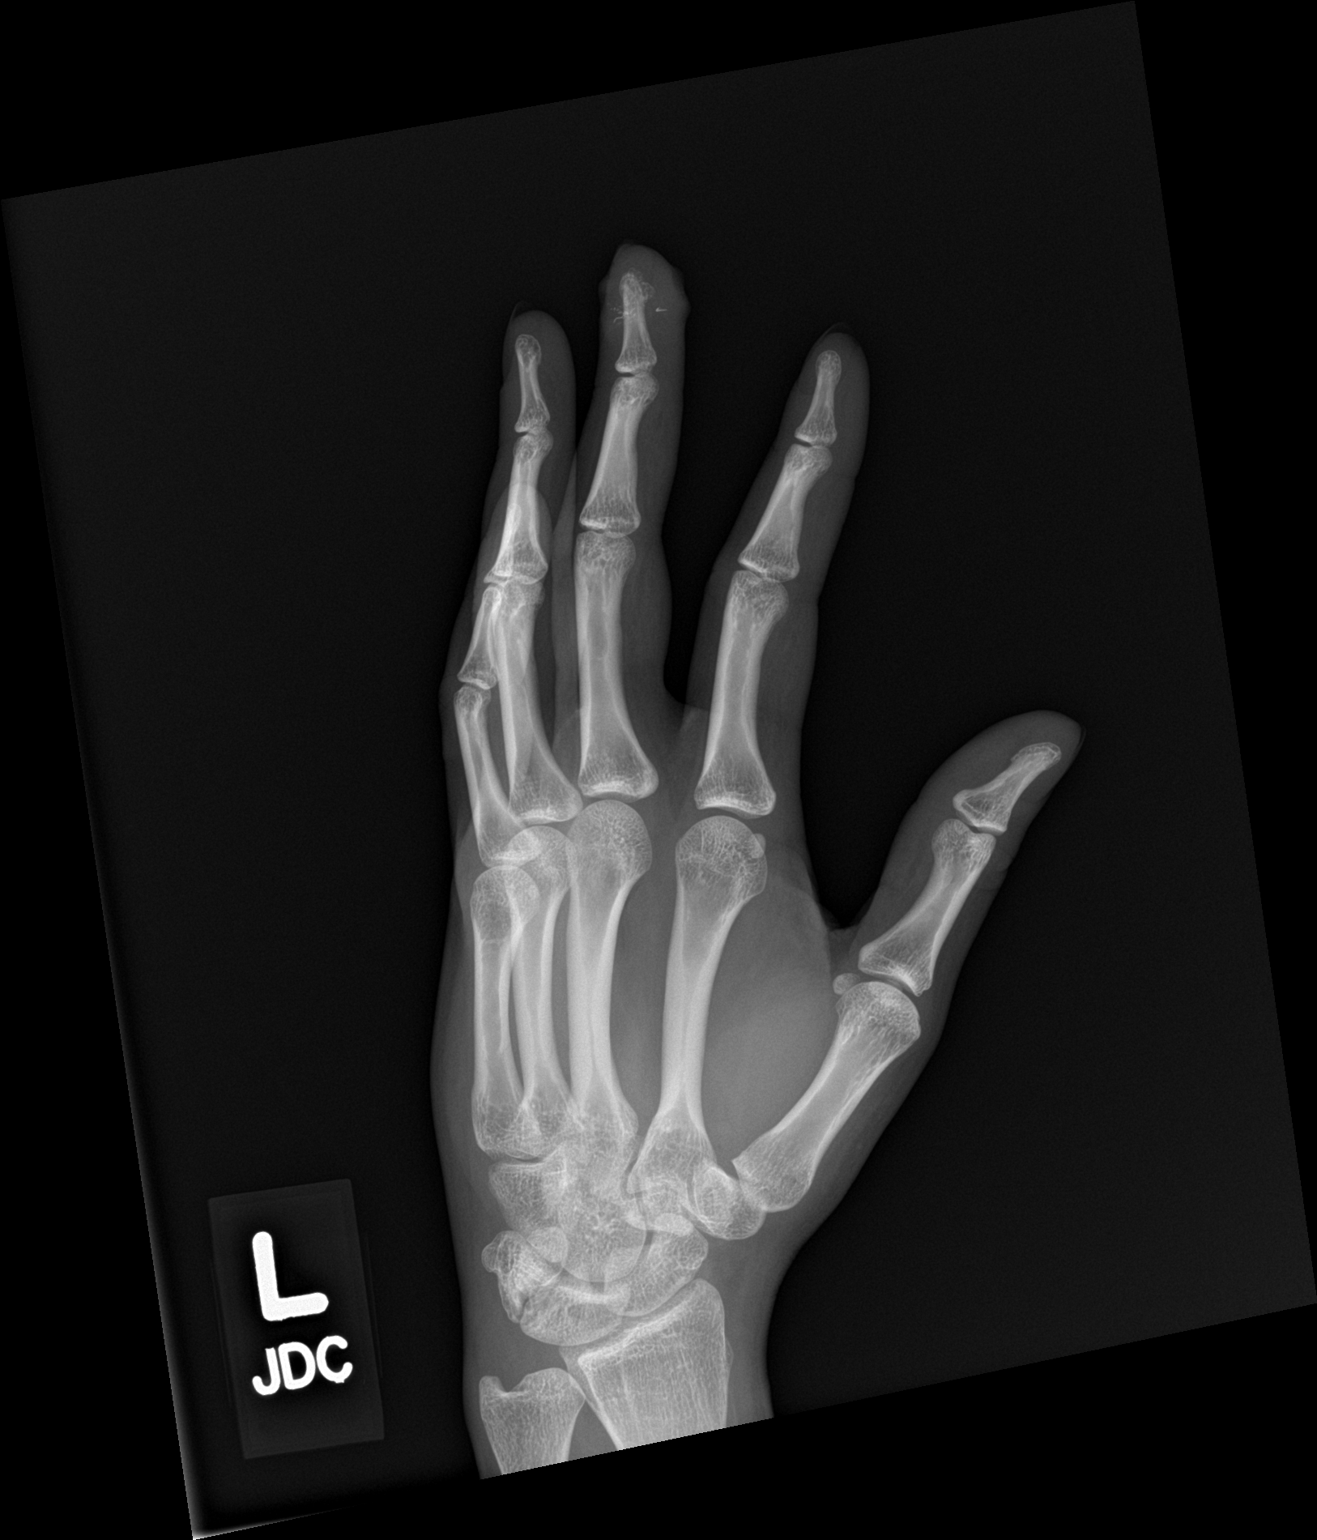

[hand lat]
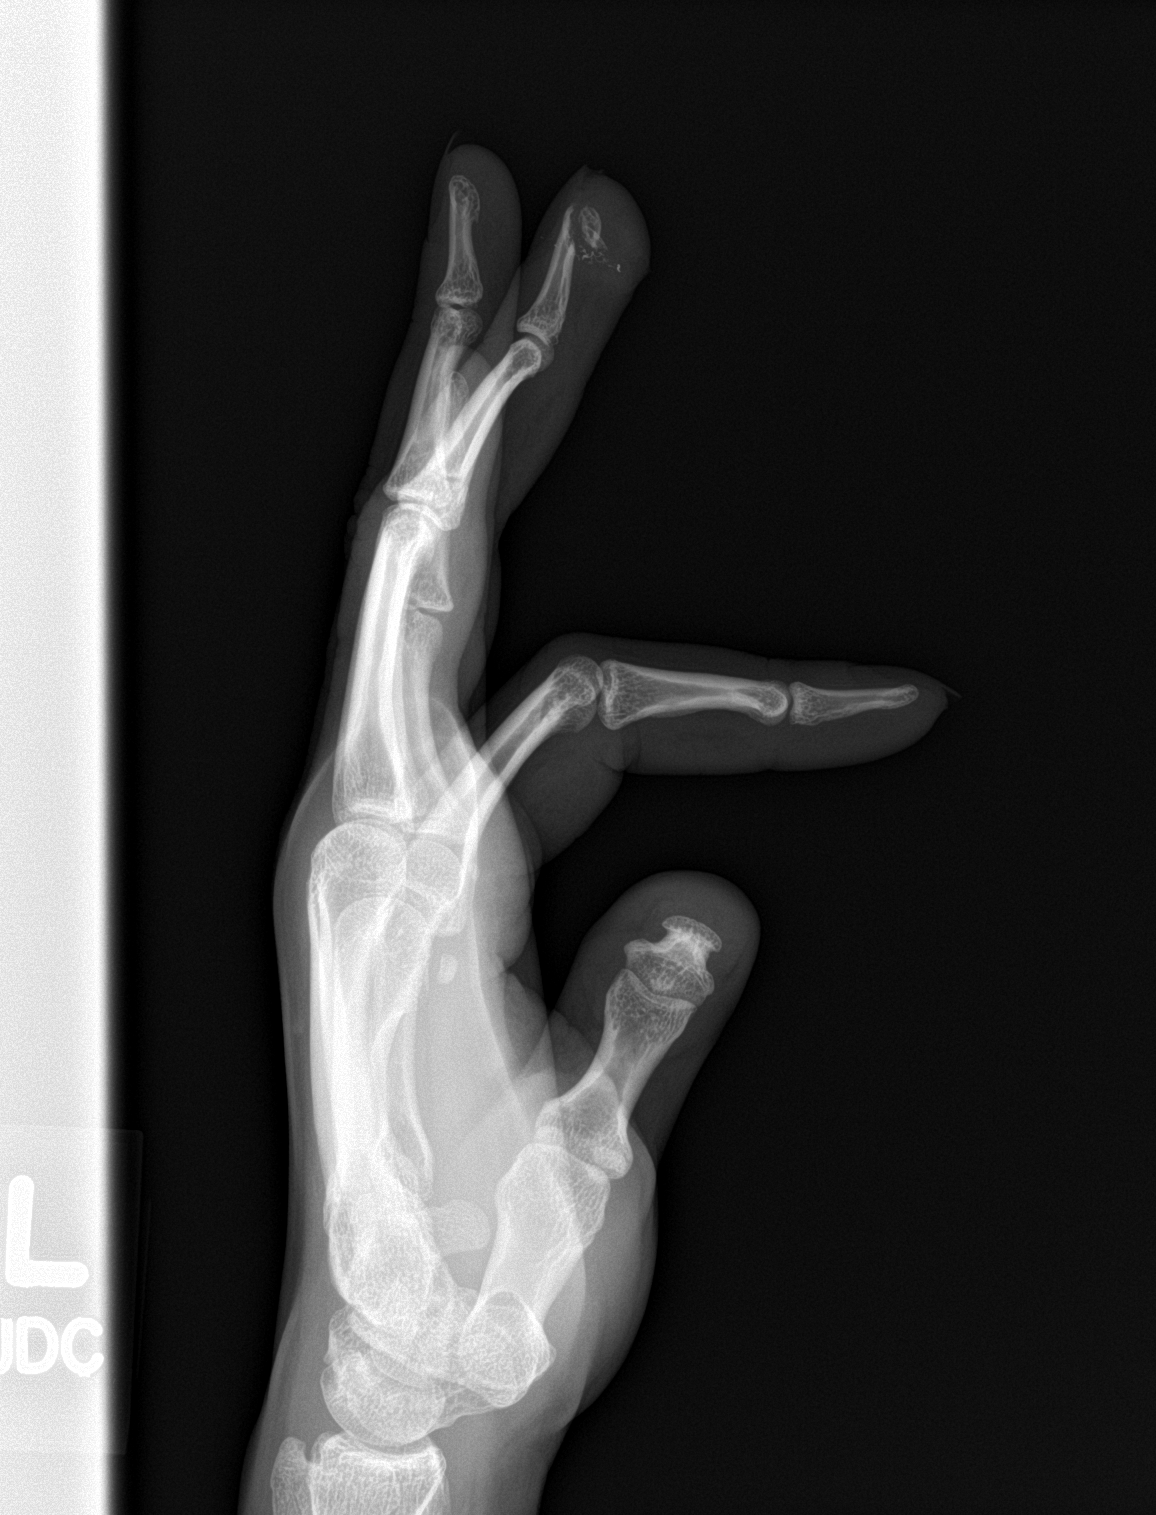

[3 of 3 positions shown; findings below may reference images not displayed]

FINDINGS: There is an oblique, mildly displaced fracture of the tuft of the
distal phalanx of the long finger. There is evidence of associated
soft tissue injury with swelling and multiple punctate and
curvilinear foreign bodies about the fracture. There is no
dislocation. Lunotriquetral joint space narrowing and subchondral
cysts are noted.
IMPRESSION: Fracture of the distal tuft of the long finger with soft tissue
injury and foreign bodies.

## 2022-02-09 ENCOUNTER — Ambulatory Visit (LOCAL_COMMUNITY_HEALTH_CENTER): Payer: Self-pay | Admitting: Nurse Practitioner

## 2022-02-09 ENCOUNTER — Ambulatory Visit: Payer: Self-pay

## 2022-02-09 ENCOUNTER — Encounter: Payer: Self-pay | Admitting: Nurse Practitioner

## 2022-02-09 VITALS — BP 125/71 | Ht 66.0 in | Wt 181.0 lb

## 2022-02-09 DIAGNOSIS — Z3046 Encounter for surveillance of implantable subdermal contraceptive: Secondary | ICD-10-CM

## 2022-02-09 DIAGNOSIS — Z113 Encounter for screening for infections with a predominantly sexual mode of transmission: Secondary | ICD-10-CM

## 2022-02-09 DIAGNOSIS — Z3009 Encounter for other general counseling and advice on contraception: Secondary | ICD-10-CM

## 2022-02-09 DIAGNOSIS — Z01419 Encounter for gynecological examination (general) (routine) without abnormal findings: Secondary | ICD-10-CM

## 2022-02-09 LAB — WET PREP FOR TRICH, YEAST, CLUE
Trichomonas Exam: NEGATIVE
Yeast Exam: NEGATIVE

## 2022-02-09 MED ORDER — NORGESTIMATE-ETH ESTRADIOL 0.25-35 MG-MCG PO TABS: 1.0000 | ORAL_TABLET | Freq: Every day | ORAL | 12 refills | Status: AC

## 2022-02-09 NOTE — Progress Notes (Signed)
Nexplanon Removal ?Patient identified, informed consent performed, consent signed.   Appropriate time out taken. Nexplanon site identified.  Area prepped in usual sterile fashon. 3 ml of 1% lidocaine with Epinephrine was used to anesthetize the area at the distal end of the implant and along implant site. A small stab incision was made right beside the implant on the distal portion.  The Nexplanon rod was grasped using hemostats/manual and removed without difficulty.  There was minimal blood loss. There were no complications.  Steri-strips were applied over the small incision.  A pressure bandage was applied to reduce any bruising.  The patient tolerated the procedure well and was given post procedure instructions.   Tyesha Joffe, FNP  ?

## 2022-02-09 NOTE — Progress Notes (Signed)
Patient here for STD testing, PE, pap smear, OCP. Consent form signed for OCP. PCP list given. Wet prep reviewed, no tx per standing orders.

## 2022-02-09 NOTE — Progress Notes (Addendum)
[facility] DEPARTMENT [facility] [address] Main Number: [phone]    Family Planning Visit- Initial Visit  Subjective:  Jennifer Rice is a 31 y.o.  G1P1001   being seen today for an initial annual visit and to discuss reproductive life planning.  The patient is currently using Hormonal Implant for pregnancy prevention. Patient reports   does not want a pregnancy in the next year.     report they are looking for a method that provides High efficacy at preventing pregnancy  Patient has the following medical conditions does not have a problem list on file.  Chief Complaint  Patient presents with   Annual Exam   Contraception    Patient reports to clinic today for a physical, STD screening, and Nexplanon removal.  Patient states that she has been experiencing some irregular bleeding, weight gain, and acne since Nexplanon inserted.  Patient would like to try OCPs.     Body mass index is 29.21 kg/m. - Patient is eligible for diabetes screening based on BMI and age >61?  not applicable HA1C ordered? not applicable  Patient reports 1  partner/s in last year. Desires STI screening?  Yes  Has patient been screened once for HCV in the past?  No  No results found for: "HCVAB"  Does the patient have current drug use (including MJ), have a partner with drug use, and/or has been incarcerated since last result? No  If yes-- Screen for HCV through [facility]   Does the patient meet criteria for HBV testing? No  Criteria:  -Household, sexual or needle sharing contact with HBV -History of drug use -HIV positive -Those with known Hep C   Health Maintenance Due  Topic Date Due   COVID-19 Vaccine (1) Never done   HIV Screening  Never done   Hepatitis C Screening  Never done   TETANUS/TDAP  Never done   PAP-Cervical Cytology Screening  Never done   PAP SMEAR-Modifier  Never done    Review of Systems  Constitutional:  Negative for  chills, fever, malaise/fatigue and weight loss.  HENT:  Negative for congestion, hearing loss and sore throat.   Eyes:  Negative for blurred vision, double vision and photophobia.  Respiratory:  Negative for shortness of breath.   Cardiovascular:  Negative for chest pain.  Gastrointestinal:  Negative for abdominal pain, blood in stool, constipation, diarrhea, heartburn, nausea and vomiting.  Genitourinary:  Negative for dysuria and frequency.  Musculoskeletal:  Negative for back pain, joint pain and neck pain.  Skin:  Negative for itching and rash.       Acne   Neurological:  Positive for headaches. Negative for dizziness and weakness.  Endo/Heme/Allergies:  Does not bruise/bleed easily.  Psychiatric/Behavioral:  Negative for depression, substance abuse and suicidal ideas.     The following portions of the patient's history were reviewed and updated as appropriate: allergies, current medications, past family history, past medical history, past social history, past surgical history and problem list. Problem list updated.   See flowsheet for other program required questions.  Objective:   Vitals:   02/09/22 1502  BP: 125/71  Weight: 181 lb (82.1 kg)  Height: 5\' 6"  (1.676 m)    Physical Exam Constitutional:      Appearance: Normal appearance.  HENT:     Head: Normocephalic. No abrasion, masses or laceration. Hair is normal.     Jaw: No tenderness or swelling.     Right Ear: External  ear normal.     Left Ear: External ear normal.     Nose: Nose normal.     Mouth/Throat:     Lips: Pink. No lesions.     Mouth: Mucous membranes are moist. No lacerations or oral lesions.     Dentition: No dental caries.     Tongue: No lesions.     Palate: No mass and lesions.     Pharynx: No pharyngeal swelling, oropharyngeal exudate, posterior oropharyngeal erythema or uvula swelling.     Tonsils: No tonsillar exudate or tonsillar abscesses.  Eyes:     Pupils: Pupils are equal, round, and  reactive to light.  Neck:     Thyroid: No thyroid mass, thyromegaly or thyroid tenderness.  Cardiovascular:     Rate and Rhythm: Normal rate and regular rhythm.  Pulmonary:     Effort: Pulmonary effort is normal.     Breath sounds: Normal breath sounds.  Abdominal:     General: Abdomen is flat. Bowel sounds are normal.     Palpations: Abdomen is soft.     Tenderness: There is no abdominal tenderness. There is no rebound.  Genitourinary:    Pubic Area: No rash or pubic lice.      Labia:        Right: No rash, tenderness or lesion.        Left: No rash, tenderness or lesion.      Vagina: Normal. No vaginal discharge, erythema, tenderness or lesions.     Cervix: No cervical motion tenderness, discharge, lesion or erythema.     Uterus: Normal.      Adnexa:        Right: Tenderness present.        Left: Tenderness present.      Rectum: Normal.     Comments: Amount Discharge: small  Odor: No pH: less than 4.5 Adheres to vaginal wall: No Color: color of discharge matches the  swab  Musculoskeletal:     Cervical back: Full passive range of motion without pain and normal range of motion.  Lymphadenopathy:     Cervical: No cervical adenopathy.     Right cervical: No superficial, deep or posterior cervical adenopathy.    Left cervical: No superficial, deep or posterior cervical adenopathy.     Upper Body:     Right upper body: No epitrochlear adenopathy.     Left upper body: No epitrochlear adenopathy.     Lower Body: No right inguinal adenopathy. No left inguinal adenopathy.  Skin:    General: Skin is warm and dry.     Findings: No erythema, laceration, lesion or rash.  Neurological:     Mental Status: She is alert and oriented to person, place, and time.  Psychiatric:        Attention and Perception: Attention normal.        Mood and Affect: Mood normal.        Speech: Speech normal.        Behavior: Behavior normal. Behavior is cooperative.       Assessment and  Plan:  Jennifer Rice is a 31 y.o. female [facility] for an initial annual wellness/contraceptive visit  Contraception counseling: Reviewed options based on patient desire and reproductive life plan. Patient is interested in Oral Contraceptive. This was provided to the patient today.   Risks, benefits, and typical effectiveness rates were reviewed.  Questions were answered.  Written information was also given to the patient to  review.    The patient will follow up in  1 years for surveillance.  The patient was told to call with any further questions, or with any concerns about this method of contraception.  Emphasized use of condoms 100% of the time for STI prevention.  Need for ECP was assessed. ECP not offered due to continuous use of birth control.    1. Family planning counseling -31 year old female in clinic today for a physical, Nexplanon removal, and STD screening. -ROS reviewed, patient states that she has been experiencing some irregular bleeding, weight gain, and acne since Nexplanon inserted.   -See Nexplanon removal note. -Patient interested in OCPs.   Patient may have Ortho-Cyclen 1 PO daily #13.  - norgestimate-ethinyl estradiol (ORTHO-CYCLEN) 0.25-35 MG-MCG tablet; Take 1 tablet by mouth daily.  Dispense: 28 tablet; Refill: 12  2. Well woman exam with routine gynecological exam -Normal well woman exam -PAP today. -CBE due 2025  - IGP, rfx Aptima HPV ASCU  3. Screening examination for venereal disease -STD screening today.  -Patient accepted all screenings including vaginal CT/GC, wet prep, and declines loodwork for HIV/RPR.  Patient meets criteria for HepB screening? No. Ordered? No - low  risk Patient meets criteria for HepC screening? No. Ordered? No - low risk  Treat wet prep per standing order Discussed time line for [facility] Lab results and that patient will be called with positive results and encouraged patient to call  if she had not heard in 2 weeks.  Counseled to return or seek care for continued or worsening symptoms Recommended condom use with all sex  Patient is currently using *Nexplanon to prevent pregnancy.    - WET PREP FOR TRICH, YEAST, CLUE - Chlamydia/Gonorrhea [facility]  4. Nexplanon removal -See Removal note.      Return in about 1 year (around 02/10/2023) for Annual well-woman exam.    Glenna Fellows, FNP

## 2022-02-16 LAB — IGP, RFX APTIMA HPV ASCU: PAP Smear Comment: 0

## 2023-06-07 ENCOUNTER — Ambulatory Visit (LOCAL_COMMUNITY_HEALTH_CENTER): Payer: Self-pay

## 2023-06-07 VITALS — BP 120/65 | Ht 66.0 in | Wt 174.0 lb

## 2023-06-07 DIAGNOSIS — O200 Threatened abortion: Secondary | ICD-10-CM

## 2023-06-07 DIAGNOSIS — Z3202 Encounter for pregnancy test, result negative: Secondary | ICD-10-CM

## 2023-06-07 LAB — PREGNANCY, URINE: Preg Test, Ur: NEGATIVE

## 2023-06-07 NOTE — Progress Notes (Signed)
UPT negative today. Results reviewed and negative preg packet given and reviewed.   Patient explains she had positive home UPT on 05/30/2023. She started period 05/31/2023 with passing of large clot and abd pain. Bleeding continued for approx one week and stopped yesterday. No pain today.  Last sex 05/30/2023. Patient states desire to become pregnant and stopped ocp 11/2022. Reports hx irregular periods and last normal period approx 02/2023. Denies period for 03/2023 and 04/2023.   Consult Aliene Altes, FNP and informed of patient status. Provider orders Beta HCG today to assess if threatened miscarriage. Once lab is resulted, patient will be counseled whether any additional tests are indicated. RN explained this to patient and she is in agreement. RN walked patient to lab. Questions answered and reports understanding.  Alex Gardener, interpreter today. Jerel Shepherd, RN

## 2023-06-07 NOTE — Addendum Note (Signed)
Addended by: Richmond Campbell R on: 06/07/2023 04:07 PM   Modules accepted: Orders

## 2023-06-09 ENCOUNTER — Telehealth: Payer: Self-pay

## 2023-06-09 LAB — BETA HCG QUANT (REF LAB): hCG Quant: 6 m[IU]/mL

## 2023-06-09 NOTE — Telephone Encounter (Signed)
Call to client with [facility] interpreter ID # [id number] and appt for beta hcg scheduled for 06/14/23 in Nurse Clinic. Jossie Ng, RN

## 2023-06-14 ENCOUNTER — Other Ambulatory Visit: Payer: Self-pay

## 2023-06-14 DIAGNOSIS — O200 Threatened abortion: Secondary | ICD-10-CM

## 2023-06-14 NOTE — Progress Notes (Signed)
In nurse clinic for Beta HCG per order by Aliene Altes, FNP dated 06/09/2023. RN walked patient to lab. All questions answered and verbalizes understanding.   Interpreter, Johnna Acosta #[id number], helped during this visit.  Abagail Kitchens, RN

## 2023-06-15 LAB — BETA HCG QUANT (REF LAB): hCG Quant: <1 m[IU]/mL

## 2023-06-19 ENCOUNTER — Telehealth: Payer: Self-pay

## 2023-06-19 NOTE — Telephone Encounter (Signed)
needs interpreter, seems to have gotten results from previous appt and wants to kn ow more about what they mean, doesn't understand. would like a call.

## 2023-06-22 NOTE — Telephone Encounter (Signed)
Called patient regarding her results for the beta HCG, had a positive PT at home, but then had a miscarriage. Initial beta HCG was 6, repeat was < 1. Miscarriage considered complete. Pt reports she does not desire a pregnancy, and desires BCM. I counseled her that she can make a family planning appointment if she is interested in having birth control.   Due to language barrier, interpreter Jonetta Speak [phone] was present for this visit.   Lenice Llamas FNP
